# Patient Record
Sex: Female | Born: 1950 | Race: White | Hispanic: No | Marital: Married | Smoking: Never smoker
Health system: Southern US, Community
[De-identification: ages and names within clinical notes are randomized; demographics above are authoritative.]

## PROBLEM LIST (undated history)

## (undated) DIAGNOSIS — F329 Major depressive disorder, single episode, unspecified: Secondary | ICD-10-CM

## (undated) DIAGNOSIS — F32A Depression, unspecified: Secondary | ICD-10-CM

## (undated) DIAGNOSIS — K219 Gastro-esophageal reflux disease without esophagitis: Secondary | ICD-10-CM

## (undated) DIAGNOSIS — E785 Hyperlipidemia, unspecified: Secondary | ICD-10-CM

## (undated) DIAGNOSIS — J310 Chronic rhinitis: Secondary | ICD-10-CM

## (undated) DIAGNOSIS — K760 Fatty (change of) liver, not elsewhere classified: Secondary | ICD-10-CM

## (undated) HISTORY — PX: OOPHORECTOMY: SHX86

---

## 2008-06-07 ENCOUNTER — Ambulatory Visit: Payer: Self-pay | Admitting: Internal Medicine

## 2009-09-13 ENCOUNTER — Ambulatory Visit: Payer: Self-pay | Admitting: Internal Medicine

## 2012-02-21 ENCOUNTER — Ambulatory Visit: Payer: Self-pay | Admitting: Internal Medicine

## 2013-06-15 ENCOUNTER — Ambulatory Visit: Payer: Self-pay | Admitting: Emergency Medicine

## 2014-10-14 ENCOUNTER — Ambulatory Visit
Admission: EM | Admit: 2014-10-14 | Discharge: 2014-10-14 | Disposition: A | Discharge status: Home / Self Care | Payer: Medicare PPO | Attending: Family Medicine | Admitting: Family Medicine

## 2014-10-14 DIAGNOSIS — W5501XA Bitten by cat, initial encounter: Secondary | ICD-10-CM | POA: Diagnosis not present

## 2014-10-14 DIAGNOSIS — S61452A Open bite of left hand, initial encounter: Secondary | ICD-10-CM

## 2014-10-14 DIAGNOSIS — L089 Local infection of the skin and subcutaneous tissue, unspecified: Secondary | ICD-10-CM

## 2014-10-14 HISTORY — DX: Depression, unspecified: F32.A

## 2014-10-14 HISTORY — DX: Gastro-esophageal reflux disease without esophagitis: K21.9

## 2014-10-14 HISTORY — DX: Major depressive disorder, single episode, unspecified: F32.9

## 2014-10-14 MED ORDER — ACETAMINOPHEN 500 MG PO TABS
500.0000 mg | ORAL_TABLET | Freq: Once | ORAL | Status: AC
  Administered 2014-10-14: 500 mg via ORAL

## 2014-10-14 MED ORDER — AMOXICILLIN-POT CLAVULANATE 875-125 MG PO TABS: 1.0000 | ORAL_TABLET | Freq: Two times a day (BID) | ORAL | Status: DC

## 2014-10-14 NOTE — ED Notes (Signed)
Cat bite to left hand about 3 days ago. Now with swelling and redness lateral side dorsal aspect from 4/5th knuckle to past wrist.

## 2014-10-14 NOTE — Discharge Instructions (Signed)
IBuprofen 200 mg 2 tablets ot Acetominophen  325 mg 2 tablets every 6 ours for fever or pain  Warm soaks for comfort  Report back to [place]- or to Emergency Roomwith increased fever, heat/swellig/redness of hand after antibiotics have had second dose     Animal Bite An animal bite can result in a scratch on the skin, deep open cut, puncture of the skin, crush injury, or tearing away of the skin or a body part. Dogs are responsible for most animal bites. Children are bitten more often than adults. An animal bite can range from very mild to more serious. A small bite from your house pet is no cause for alarm. However, some animal bites can become infected or injure a bone or other tissue. You must seek medical care if:  The skin is broken and bleeding does not slow down or stop after 15 minutes.  The puncture is deep and difficult to clean (such as a cat bite).  Pain, warmth, redness, or pus develops around the wound.  The bite is from a stray animal or rodent. There may be a risk of rabies infection.  The bite is from a snake, raccoon, skunk, fox, coyote, or bat. There may be a risk of rabies infection.  The person bitten has a chronic illness such as diabetes, liver disease, or cancer, or the person takes medicine that lowers the immune system.  There is concern about the location and severity of the bite. It is important to clean and protect an animal bite wound right away to prevent infection. Follow these steps:  Clean the wound with plenty of water and soap.  Apply an antibiotic cream.  Apply gentle pressure over the wound with a clean towel or gauze to slow or stop bleeding.  Elevate the affected area above the heart to help stop any bleeding.  Seek medical care. Getting medical care within 8 hours of the animal bite leads to the best possible outcome. DIAGNOSIS  Your caregiver will most likely:  Take a detailed history of the animal and the bite injury.  Perform a wound  exam.  Take your medical history. Blood tests or X-rays may be performed. Sometimes, infected bite wounds are cultured and sent to a lab to identify the infectious bacteria.  TREATMENT  Medical treatment will depend on the location and type of animal bite as well as the patient's medical history. Treatment may include:  Wound care, such as cleaning and flushing the wound with saline solution, bandaging, and elevating the affected area.  Antibiotics.  Tetanus immunization.  Rabies immunization.  Leaving the wound open to heal. This is often done with animal bites, due to the high risk of infection. However, in certain cases, wound closure with stitches, wound adhesive, skin adhesive strips, or staples may be used. Infected bites that are left untreated may require intravenous (IV) antibiotics and surgical treatment in the hospital. HOME CARE INSTRUCTIONS  Follow your caregiver's instructions for wound care.  Take all medicines as directed.  If your caregiver prescribes antibiotics, take them as directed. Finish them even if you start to feel better.  Follow up with your caregiver for further exams or immunizations as directed. You may need a tetanus shot if:  You cannot remember when you had your last tetanus shot.  You have never had a tetanus shot.  The injury broke your skin. If you get a tetanus shot, your arm may swell, get red, and feel warm to the touch. This is  common and not a problem. If you need a tetanus shot and you choose not to have one, there is a rare chance of getting tetanus. Sickness from tetanus can be serious. SEEK MEDICAL CARE IF:  You notice warmth, redness, soreness, swelling, pus discharge, or a bad smell coming from the wound.  You have a red line on the skin coming from the wound.  You have a fever, chills, or a general ill feeling.  You have nausea or vomiting.  You have continued or worsening pain.  You have trouble moving the injured  part.  You have other questions or concerns. MAKE SURE YOU:  Understand these instructions.  Will watch your condition.  Will get help right away if you are not doing well or get worse. Document Released: 12/16/2010 Document Revised: 06/22/2011 Document Reviewed: 12/16/2010 [facility] Patient Information 2015 [place]. This information is not intended to replace advice given to you by your health care provider. Make sure you discuss any questions you have with your health care provider.

## 2014-10-17 ENCOUNTER — Encounter: Payer: Self-pay | Admitting: Physician Assistant

## 2014-10-17 NOTE — ED Provider Notes (Signed)
CSN: [id number]     Arrival date & time 10/14/14  1457 History   First MD Initiated Contact with Patient 10/14/14 1526     Chief Complaint  Patient presents with  . Animal Bite  . Hand Injury   (Consider location/radiation/quality/duration/timing/severity/associated sxs/prior Treatment) HPI 64 yo F brought by her son out of concern about red, swollen left hand and wrist. Bitten by personal cat, of the home, 3 days ago. Patient has been treating it with ice cubes. Cat "Sunshine" [place] [employer]. Patient reports that animals  vaccinations are up to date. Office is closed . Animal Control also closed for the holiday.  Past Medical History  Diagnosis Date  . GERD (gastroesophageal reflux disease)   . Depressed    History reviewed. No pertinent past surgical history. History reviewed. No pertinent family history. History  Substance Use Topics  . Smoking status: Never Smoker   . Smokeless tobacco: Never Used  . Alcohol Use: No   OB History    No data available     Review of Systems Constitutional:Temp 100  Eyes: No visual changes. ENT:No sore throat. Cardiovascular:Negative for chest pain/palpitations Respiratory: Negative for shortness of breath Gastrointestinal: No abdominal pain. No nausea,vomiting, diarrhea Genitourinary: Negative for dysuria. Normal urination. Musculoskeletal: Negative for back pain. Left forearm and wrist dorsum hot, red, painful Skin: positive for erythema Neurological: Negative for headache, focal weakness or numbness  Allergies  Sulfa antibiotics  Home Medications   Prior to Admission medications   Medication Sig Start Date End Date Taking? Authorizing Provider  escitalopram (LEXAPRO) 10 MG tablet Take 10 mg by mouth daily.   Yes Historical Provider, MD  omeprazole (PRILOSEC) 20 MG capsule Take 20 mg by mouth daily.   Yes Historical Provider, MD  amoxicillin-clavulanate (AUGMENTIN)  875-125 MG per tablet Take 1 tablet by mouth every 12 (twelve) hours. 10/14/14   Rae HalstedLaurie W Walker [place], PA-C   BP 140/67 mmHg  Pulse 70  Temp(Src) 100 F (37.8 C) (Tympanic)  Resp 20  Ht 5\' 1"  (1.549 m)  Wt 174 lb (78.926 kg)  BMI 32.89 kg/m2  SpO2 73% Physical Exam   Constitutional -alert and oriented,concerned because hand getting worse Head-atraumatic, normocephalic Eyes- conjunctiva normal, EOMI ,conjugate gaze Nose- no congestion  Mouth/throat- mucous membranes moist , Neck- supple  CV- regular rate,   Resp-no distress, normal respiratory effort, MSK/Skin-  Tender, swollen , inflamed left forearm/wrist/dorsum of hand. Cellulitis 10 cm long by 6 cm wide.Skin marking done.  Warm to palpation, No streaking, Puncture wound site possibly identified. No drainage, no defined abscess. Good pulse, good cap fill. Normal distal sensation Neuro- normal speech and language, Psych-mood and affect grossly normal   ED Course  Procedures (including critical care time) Labs Review Labs Reviewed - No data to display  Imaging Review No results found.   MDM   1. Cat bite of hand, left, initial encounter   2. Cat bite of left hand with infection, initial encounter    Plan: 1. diagnosis reviewed with patient- skin markings reviewed and signifigance of infection margins for future reference 2. Rx as per orders; risks, benefits, potential side effects reviewed with patient 3. Recommend supportive treatment with warm wraps, warm soaks. Tylenol/ibuprofen for fever or discomfort. Cap fill finger checks taught. Elevate hand to reduce swelling. 4. F/u prn if symptoms worsen or don't improve- plan 3 days return for re-evaluation-more quickly if erythema advances outside the  marked margins after antibiotic on board or if fever /malaise develop that are not controlled by ibuprofen /tylenol. Impressed upon her the importance of being proactive with follow up if needed. 5. Police dept notified by nursing staff  and process for animal follow up established.  . Discharge Medication List as of 10/14/2014  3:37 PM    START taking these medications   Details  amoxicillin-clavulanate (AUGMENTIN) 875-125 MG per tablet Take 1 tablet by mouth every 12 (twelve) hours., Starting 10/14/2014, Until Discontinued, Normal          Rae Halsted, PA-C 10/17/14 [phone]

## 2016-12-25 ENCOUNTER — Encounter: Payer: Self-pay | Admitting: Emergency Medicine

## 2016-12-25 ENCOUNTER — Emergency Department
Admission: EM | Admit: 2016-12-25 | Discharge: 2016-12-25 | Disposition: A | Discharge status: Home / Self Care | Payer: Medicare Other | Attending: Emergency Medicine | Admitting: Emergency Medicine

## 2016-12-25 DIAGNOSIS — H5712 Ocular pain, left eye: Secondary | ICD-10-CM | POA: Diagnosis present

## 2016-12-25 DIAGNOSIS — Z79899 Other long term (current) drug therapy: Secondary | ICD-10-CM | POA: Insufficient documentation

## 2016-12-25 DIAGNOSIS — H109 Unspecified conjunctivitis: Secondary | ICD-10-CM | POA: Insufficient documentation

## 2016-12-25 MED ORDER — TOBRAMYCIN 0.3 % OP SOLN: 2.0000 [drp] | OPHTHALMIC | 0 refills | Status: DC

## 2016-12-25 MED ORDER — EYE WASH OPHTH SOLN
1.0000 [drp] | OPHTHALMIC | Status: DC | PRN
  Administered 2016-12-25: 1 [drp] via OPHTHALMIC
  Filled 2016-12-25: qty 118

## 2016-12-25 MED ORDER — TETRACAINE HCL 0.5 % OP SOLN
2.0000 [drp] | Freq: Once | OPHTHALMIC | Status: AC
  Administered 2016-12-25: 2 [drp] via OPHTHALMIC
  Filled 2016-12-25: qty 4

## 2016-12-25 MED ORDER — FLUORESCEIN SODIUM 0.6 MG OP STRP
1.0000 | ORAL_STRIP | Freq: Once | OPHTHALMIC | Status: AC
  Administered 2016-12-25: 1 via OPHTHALMIC
  Filled 2016-12-25: qty 1

## 2016-12-25 NOTE — ED Triage Notes (Signed)
Patient presents to ED via POV from home. Patient states on Wednesday she was mowing the grass and afterwards noticed some eye irritation. Patient states today her eye was swollen this morning. Patient reports clear drainage. Patient has been using eye drops at home.

## 2016-12-25 NOTE — Discharge Instructions (Signed)
Use the eye drops as instructed.  Apply ice as needed for swelling.  If not better in 3 days please follow up with your regular eye doctor or Dr Brooke Dare.  If you have visual changes or increased pain of the eye please return to the emergency room

## 2016-12-25 NOTE — ED Notes (Signed)
Pt discharged to home.  Family member driving.  Discharge instructions reviewed.  Verbalized understanding.  No questions or concerns at this time.  Teach back verified.  Pt in NAD.  No items left in ED.   

## 2016-12-25 NOTE — ED Notes (Signed)
See triage note  Presents with pain and clear drainage from left eye  Unsure if she had gotten some in her eye when she was mowing on weds

## 2016-12-25 NOTE — ED Provider Notes (Signed)
[facility] Emergency Department Provider Note  ____________________________________________   First MD Initiated Contact with Patient 12/25/16 1254     (approximate)  I have reviewed the triage vital signs and the nursing notes.   HISTORY  Chief Complaint Eye Pain    HPI Erica Oneal is a 66 y.o. female who states she was mowing the yard 3 days agoand felt something in her eye. Over the next 2 days the eye has become more red and irritated. She doesn't feel like there is anything in the eye and has not had any visual changes. Denies matting and drainage.  She is due for her yearly eye exam and will be calling her doctor shortly   Past Medical History:  Diagnosis Date  . Depressed   . GERD (gastroesophageal reflux disease)     There are no active problems to display for this patient.   History reviewed. No pertinent surgical history.  Prior to Admission medications   Medication Sig Start Date End Date Taking? Authorizing Provider  escitalopram (LEXAPRO) 10 MG tablet Take 10 mg by mouth daily.    [provider]  omeprazole (PRILOSEC) 20 MG capsule Take 20 mg by mouth daily.    [provider]  tobramycin (TOBREX) 0.3 % ophthalmic solution Place 2 drops into the left eye every 4 (four) hours. 12/25/16   Faythe Ghee, PA-C    Allergies Sulfa antibiotics  No family history on file.  Social History Social History  Substance Use Topics  . Smoking status: Never Smoker  . Smokeless tobacco: Never Used  . Alcohol use No    Review of Systems  Constitutional: No fever/chills Eyes: No visual changes.Positive redness and swelling ENT: No sore throat. Respiratory: Denies coug Musculoskeletal: Negative for back pain. Skin: Negative for rash.    ____________________________________________   PHYSICAL EXAM:  VITAL SIGNS: ED Triage Vitals [12/25/16 1243]  Enc Vitals Group     BP (!) 142/69     Pulse Rate 70     Resp  15     Temp 98.6 F (37 C)     Temp Source Oral     SpO2 95 %     Weight 165 lb (74.8 kg)     Height 5' (1.524 m)     Head Circumference      Peak Flow      Pain Score 0     Pain Loc      Pain Edu?      Excl. in GC?     Constitutional: Alert and oriented. Well appearing and in no acute distress. Eyes: Conjunctivae In the left eye is injected and swollen. No foreign body noted. Applied tetracaine and fluoresceins stain to the left eye. No abrasion or foreign body noted with the [place] lamp lids flipped and no foreign body noted under the lid Head: Atraumatic. Nose: No congestion/rhinnorhea. Mouth/Throat: Mucous membranes are moist.   Cardiovascular: Normal rate, regular rhythm. Respiratory: Normal respiratory effort.  No retractions Musculoskeletal: FROM all extremities, warm and well perfused Neurologic:  Normal speech and language.  Skin:  Skin is warm, dry and intact. No rash noted. Psychiatric: Mood and affect are normal. Speech and behavior are normal.  ____________________________________________   LABS (all labs ordered are listed, but only abnormal results are displayed)  Labs Reviewed - No data to display ____________________________________________   ____________________________________________  RADIOLOGY None  ____________________________________________   PROCEDURES  Procedure(s) performed:       ____________________________________________  INITIAL IMPRESSION / ASSESSMENT AND PLAN / ED COURSE  Pertinent labs & imaging results that were available during my care of the patient were reviewed by me and considered in my medical decision making (see chart for details).  Acute conjunctivitis.  Patient does not have visual changes. Treat with tobramycin ophthalmic drops. Patient is to follow-up with her provider or on-call ophthalmology as recommended       ____________________________________________   FINAL CLINICAL IMPRESSION(S) / ED  DIAGNOSES  Final diagnoses:  Left eye pain  Conjunctivitis of left eye, unspecified conjunctivitis type      NEW MEDICATIONS STARTED DURING THIS VISIT:  New Prescriptions   TOBRAMYCIN (TOBREX) 0.3 % OPHTHALMIC SOLUTION    Place 2 drops into the left eye every 4 (four) hours.     Note:  This document was prepared using Dragon voice recognition software and may include unintentional dictation errors.    Faythe Ghee, PA-C 12/25/16 1352    Schaevitz, Myra Rude, MD 12/25/16 1400

## 2017-06-15 ENCOUNTER — Ambulatory Visit (INDEPENDENT_AMBULATORY_CARE_PROVIDER_SITE_OTHER): Payer: Medicare Other

## 2017-06-15 ENCOUNTER — Other Ambulatory Visit: Payer: Self-pay

## 2017-06-15 ENCOUNTER — Ambulatory Visit
Admission: EM | Admit: 2017-06-15 | Discharge: 2017-06-15 | Disposition: A | Discharge status: Home / Self Care | Payer: Medicare Other | Attending: Family Medicine | Admitting: Family Medicine

## 2017-06-15 ENCOUNTER — Encounter: Payer: Self-pay | Admitting: Emergency Medicine

## 2017-06-15 DIAGNOSIS — S93402A Sprain of unspecified ligament of left ankle, initial encounter: Secondary | ICD-10-CM

## 2017-06-15 DIAGNOSIS — M25572 Pain in left ankle and joints of left foot: Secondary | ICD-10-CM | POA: Diagnosis not present

## 2017-06-15 HISTORY — DX: Hyperlipidemia, unspecified: E78.5

## 2017-06-15 NOTE — ED Triage Notes (Signed)
Patient in today c/o left ankle pain and swelling. She fell down some steps in her basement on 06/12/17.

## 2017-06-15 NOTE — ED Provider Notes (Signed)
[facility]    CSN: [id number] Arrival date & time: 06/15/17  1150     History   Chief Complaint Chief Complaint  Patient presents with  . Ankle Pain    HPI Erica HeadsLinda Oneal is a 67 y.o. female.   67 yo female with a c/o left ankle pain, swelling and bruising for the last 2 days, since falling down some steps at home and injuring it.    The history is provided by the patient.  Ankle Pain    Past Medical History:  Diagnosis Date  . Depressed   . GERD (gastroesophageal reflux disease)   . Hyperlipidemia     There are no active problems to display for this patient.   History reviewed. No pertinent surgical history.  OB History    No data available       Home Medications    Prior to Admission medications   Medication Sig Start Date End Date Taking? Authorizing Provider  atorvastatin (LIPITOR) 80 MG tablet Take 40 mg by mouth daily.   Yes [provider]  escitalopram (LEXAPRO) 10 MG tablet Take 10 mg by mouth daily.   Yes [provider]  omeprazole (PRILOSEC) 20 MG capsule Take 20 mg by mouth daily.   Yes [provider]  tobramycin (TOBREX) 0.3 % ophthalmic solution Place 2 drops into the left eye every 4 (four) hours. 12/25/16   Sherrie MustacheFisher, Roselyn BeringSusan W, PA-C    Family History Family History  Problem Relation Age of Onset  . Alzheimer's disease Mother   . Heart attack Father     Social History Social History   Tobacco Use  . Smoking status: Never Smoker  . Smokeless tobacco: Never Used  Substance Use Topics  . Alcohol use: No  . Drug use: No     Allergies   Sulfa antibiotics   Review of Systems Review of Systems   Physical Exam Triage Vital Signs ED Triage Vitals  Enc Vitals Group     BP 06/15/17 1211 132/79     Pulse Rate 06/15/17 1211 70     Resp 06/15/17 1211 16     Temp 06/15/17 1211 98.2 F (36.8 C)     Temp Source 06/15/17 1211 Oral     SpO2 06/15/17 1211 100 %     Weight 06/15/17 1210 170 lb (77.1  kg)     Height 06/15/17 1210 5' (1.524 m)     Head Circumference --      Peak Flow --      Pain Score 06/15/17 1210 6     Pain Loc --      Pain Edu? --      Excl. in GC? --    No data found.  Updated Vital Signs BP 132/79 (BP Location: Left Arm)   Pulse 70   Temp 98.2 F (36.8 C) (Oral)   Resp 16   Ht 5' (1.524 m)   Wt 170 lb (77.1 kg)   SpO2 100%   BMI 33.20 kg/m   Visual Acuity Right Eye Distance:   Left Eye Distance:   Bilateral Distance:    Right Eye Near:   Left Eye Near:    Bilateral Near:     Physical Exam  Constitutional: She appears well-developed and well-nourished. No distress.  Musculoskeletal:       Left ankle: She exhibits swelling and ecchymosis. She exhibits normal range of motion, no deformity, no laceration and normal pulse. Tenderness. Lateral malleolus and AITFL tenderness found.  No medial malleolus, no CF ligament, no posterior TFL, no head of 5th metatarsal and no proximal fibula tenderness found. Achilles tendon normal.  Skin: She is not diaphoretic.  Nursing note and vitals reviewed.    UC Treatments / Results  Labs (all labs ordered are listed, but only abnormal results are displayed) Labs Reviewed - No data to display  EKG  EKG Interpretation None       Radiology Dg Ankle Complete Left  Result Date: 06/15/2017 CLINICAL DATA:  Larey Seat 2 days ago.  Lateral ankle pain. EXAM: LEFT ANKLE COMPLETE - 3+ VIEW COMPARISON:  09/13/2009 FINDINGS: Mild lateral soft tissue swelling. No sign of fracture or dislocation. Minimal bony irregularity of the superior surface of the anterior process of the talus is chronic related to an injury in 2011. IMPRESSION: Lateral soft tissue swelling.  No acute bone finding. Electronically Signed   By: Paulina Fusi M.D.   On: 06/15/2017 12:43    Procedures Procedures (including critical care time)  Medications Ordered in UC Medications - No data to display   Initial Impression / Assessment and Plan / UC Course   I have reviewed the triage vital signs and the nursing notes.  Pertinent labs & imaging results that were available during my care of the patient were reviewed by me and considered in my medical decision making (see chart for details).       Final Clinical Impressions(s) / UC Diagnoses   Final diagnoses:  Sprain of left ankle, unspecified ligament, initial encounter    ED Discharge Orders    None     1. x-ray results and diagnosis reviewed with patient 2.. Recommend supportive treatment with rest, ice, elevation, ankle brace; otc analgesics prn 3. Follow-up prn if symptoms worsen or don't improve  Controlled Substance Prescriptions  Controlled Substance Registry consulted? Not Applicable   Payton Mccallum, MD 06/15/17 [phone]

## 2019-03-30 IMAGING — CR DG ANKLE COMPLETE 3+V*L*
3 series · 3 of 3 positions shown · non-contrast
Comparison: 09/13/2009

CLINICAL DATA: Fell 2 days ago.  Lateral ankle pain.

EXAM:
LEFT ANKLE COMPLETE - 3+ VIEW

[ankle ap]
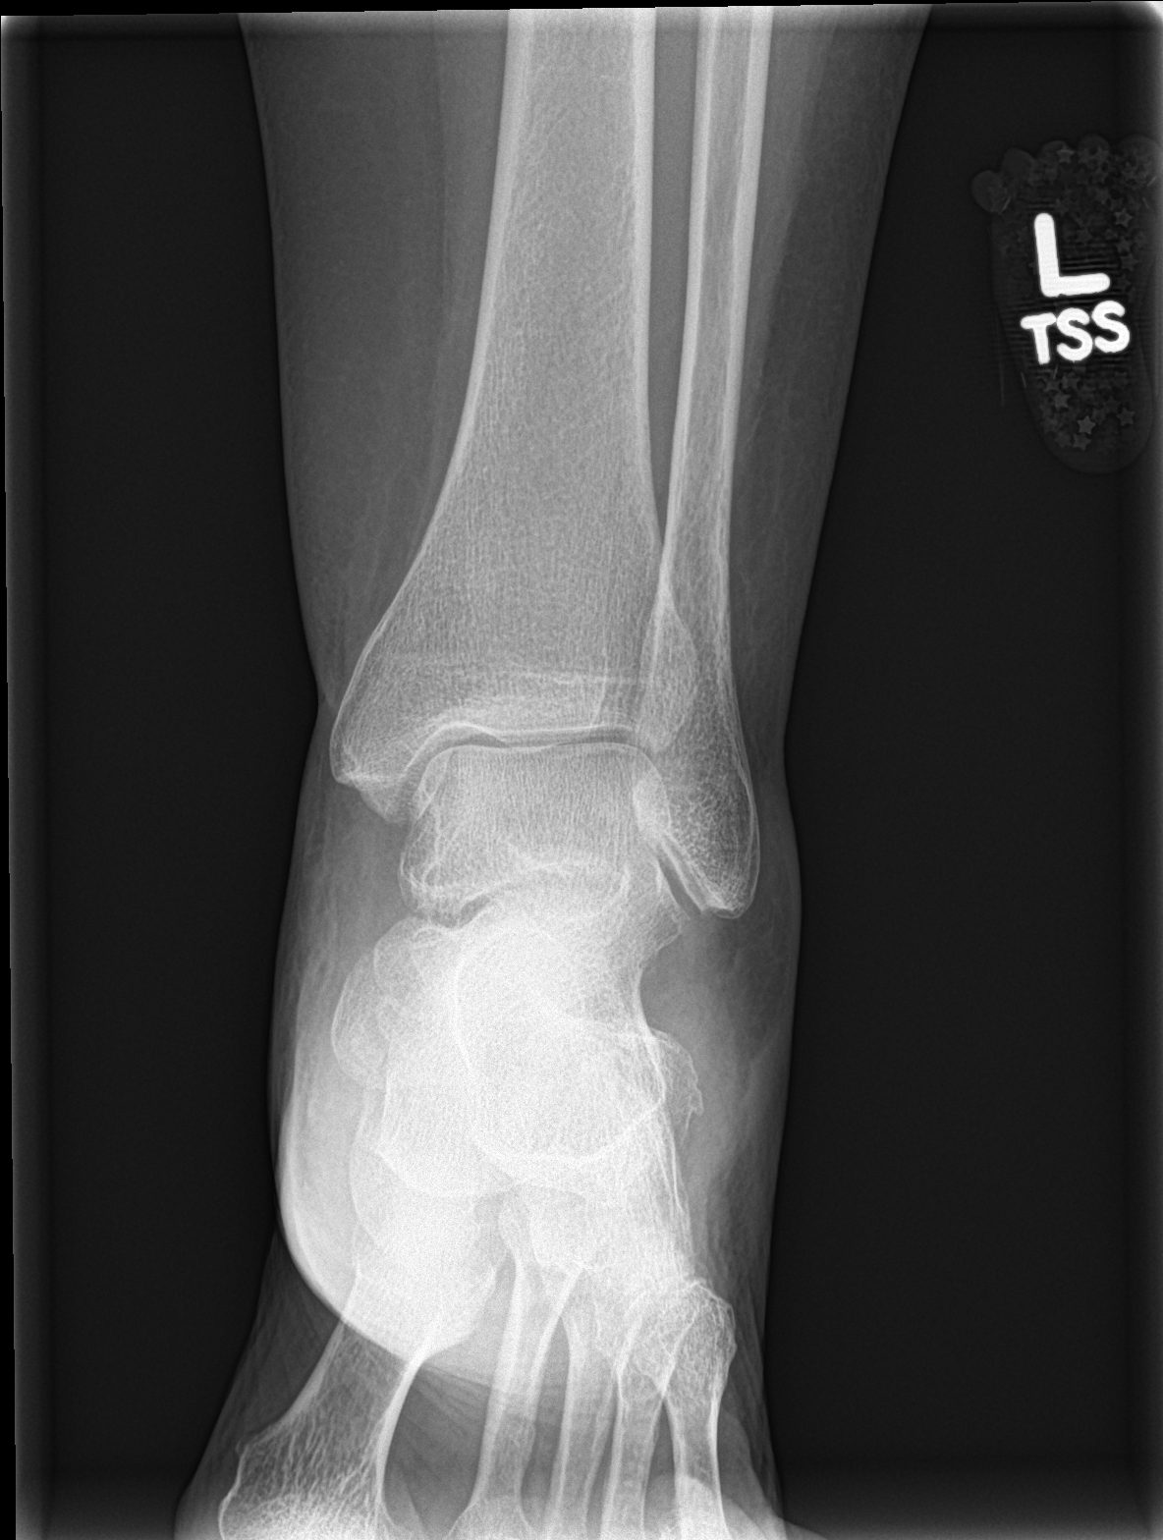

[ankle obl]
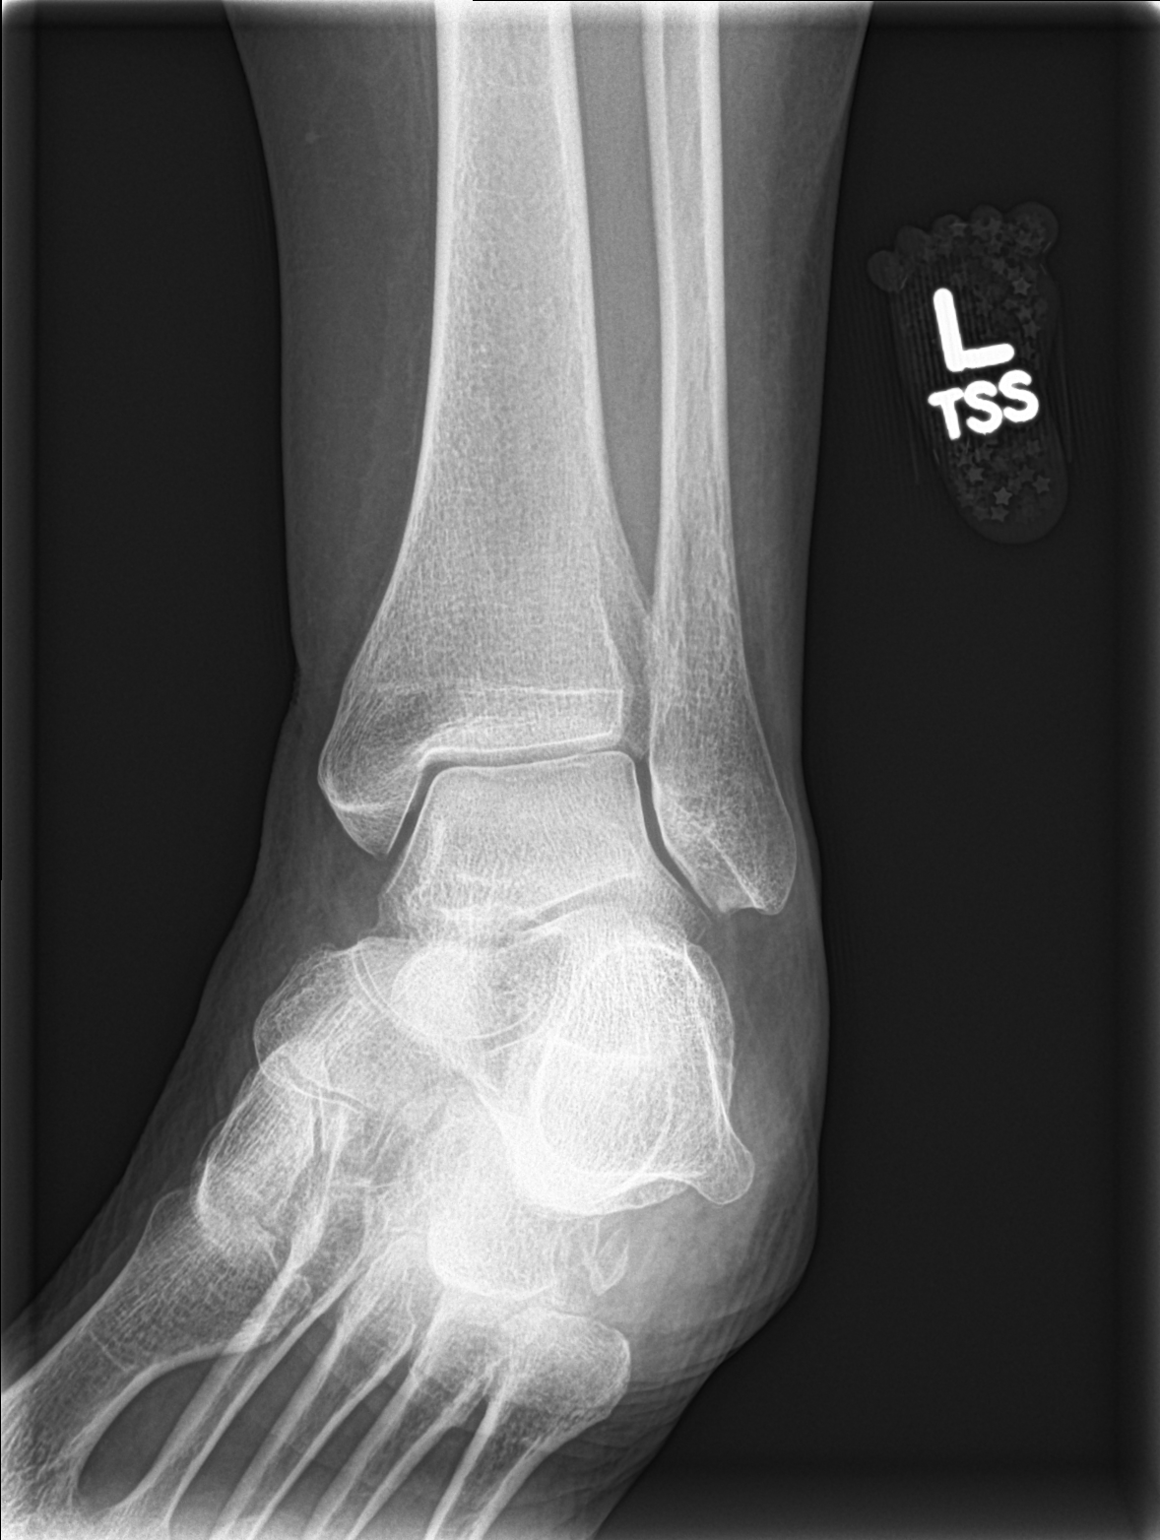

[ankle lat]
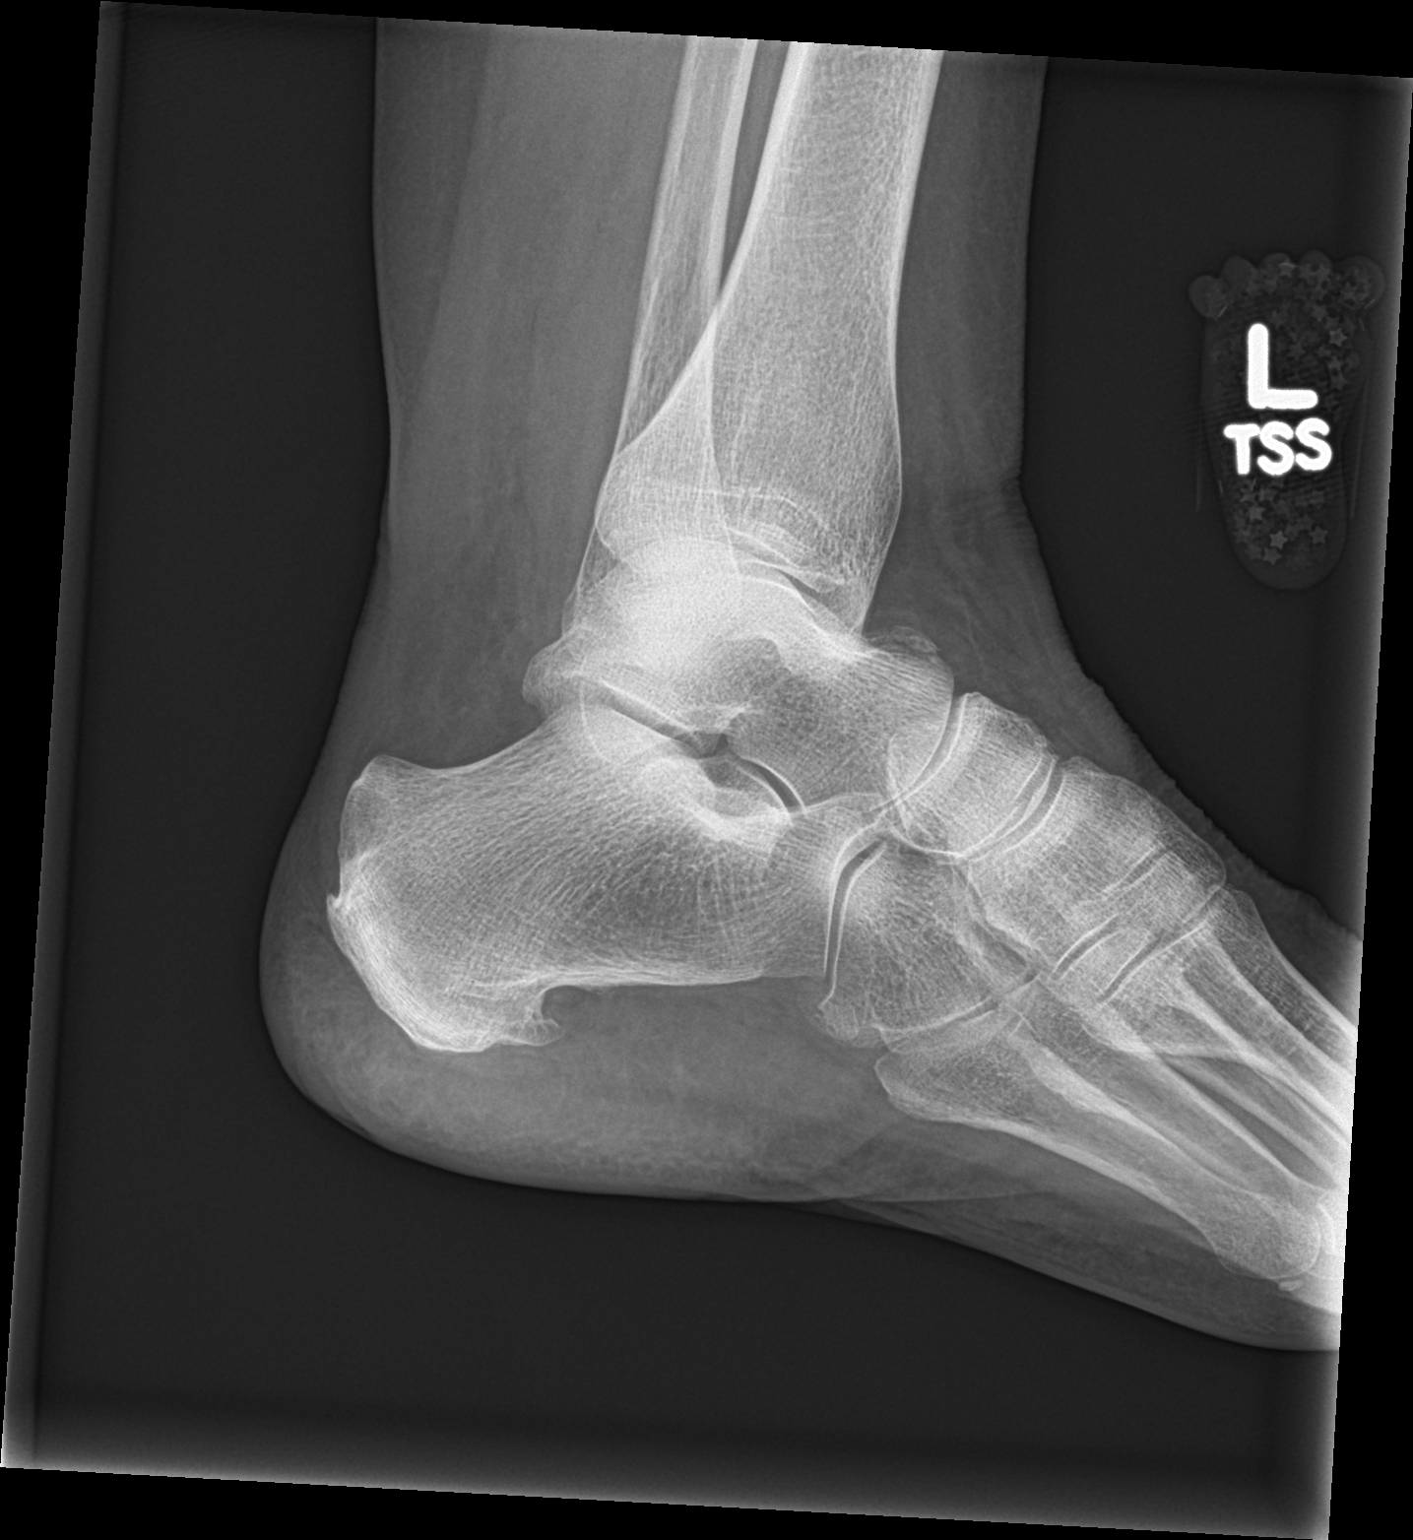

[3 of 3 positions shown; findings below may reference images not displayed]

FINDINGS: Mild lateral soft tissue swelling. No sign of fracture or
dislocation. Minimal bony irregularity of the superior surface of
the anterior process of the talus is chronic related to an injury in
9200.
IMPRESSION: Lateral soft tissue swelling.  No acute bone finding.

## 2020-03-22 ENCOUNTER — Other Ambulatory Visit: Payer: Self-pay

## 2020-03-22 ENCOUNTER — Ambulatory Visit
Admission: EM | Admit: 2020-03-22 | Discharge: 2020-03-22 | Disposition: A | Discharge status: Home / Self Care | Payer: Medicare PPO | Attending: Sports Medicine | Admitting: Sports Medicine

## 2020-03-22 ENCOUNTER — Encounter: Payer: Self-pay | Admitting: Emergency Medicine

## 2020-03-22 DIAGNOSIS — L237 Allergic contact dermatitis due to plants, except food: Secondary | ICD-10-CM

## 2020-03-22 MED ORDER — METHYLPREDNISOLONE SODIUM SUCC 125 MG IJ SOLR
125.0000 mg | Freq: Once | INTRAMUSCULAR | Status: AC
  Administered 2020-03-22: 125 mg via INTRAMUSCULAR

## 2020-03-22 NOTE — ED Provider Notes (Signed)
[facility]    CSN: [id number] Arrival date & time: 03/22/20  1126      History   Chief Complaint Chief Complaint  Patient presents with  . Poison Ivy    HPI Erica Oneal is a 69 y.o. female.   69 yo female with rash that she attributes to poison ivy exposure 1 week ago.  Supportive care is not helping and rash has spread from left forearm to the right forearm, abdomen and left upper thigh.  No fevers, shakes, chills.  No red flag signs or symptoms.     Past Medical History:  Diagnosis Date  . Depressed   . GERD (gastroesophageal reflux disease)   . Hyperlipidemia     There are no problems to display for this patient.   History reviewed. No pertinent surgical history.  OB History   No obstetric history on file.      Home Medications    Prior to Admission medications   Medication Sig Start Date End Date Taking? Authorizing Provider  atorvastatin (LIPITOR) 80 MG tablet Take 40 mg by mouth daily.   Yes [provider]  escitalopram (LEXAPRO) 10 MG tablet Take 10 mg by mouth daily.   Yes [provider]  omeprazole (PRILOSEC) 20 MG capsule Take 20 mg by mouth daily.   Yes [provider]  tobramycin (TOBREX) 0.3 % ophthalmic solution Place 2 drops into the left eye every 4 (four) hours. 12/25/16   Sherrie Mustache Roselyn Bering, PA-C    Family History Family History  Problem Relation Age of Onset  . Alzheimer's disease Mother   . Heart attack Father     Social History Social History   Tobacco Use  . Smoking status: Never Smoker  . Smokeless tobacco: Never Used  Vaping Use  . Vaping Use: Never used  Substance Use Topics  . Alcohol use: No  . Drug use: No     Allergies   Sulfa antibiotics   Review of Systems Review of Systems  Skin: Positive for rash. Negative for pallor and wound.  All other systems reviewed and are negative.    Physical Exam Triage Vital Signs ED Triage Vitals [03/22/20 1142]  Enc Vitals Group      BP (!) 153/79     Pulse Rate 72     Resp 16     Temp 98.8 F (37.1 C)     Temp Source Oral     SpO2 98 %     Weight      Height      Head Circumference      Peak Flow      Pain Score 0     Pain Loc      Pain Edu?      Excl. in GC?    No data found.  Updated Vital Signs BP (!) 153/79 (BP Location: Left Arm)   Pulse 72   Temp 98.8 F (37.1 C) (Oral)   Resp 16   SpO2 98%   Visual Acuity Right Eye Distance:   Left Eye Distance:   Bilateral Distance:    Right Eye Near:   Left Eye Near:    Bilateral Near:     Physical Exam Vitals and nursing note reviewed.  Constitutional:      General: She is not in acute distress.    Appearance: Normal appearance. She is not ill-appearing or toxic-appearing.  HENT:     Head: Normocephalic and atraumatic.  Skin:    General: Skin  is warm and dry.     Capillary Refill: Capillary refill takes less than 2 seconds.     Findings: Rash present.     Comments: Area of small intensely pruritic vesicles on the left forearm, right forearm, left thigh, and abdomen consistent with poison ivy dermatitis.  No evidence of superimposed bacterial infection, No drainage.  Neurological:     General: No focal deficit present.     Mental Status: She is alert.  Psychiatric:        Mood and Affect: Mood normal.        Behavior: Behavior normal.      UC Treatments / Results  Labs (all labs ordered are listed, but only abnormal results are displayed) Labs Reviewed - No data to display  EKG   Radiology No results found.  Procedures Procedures (including critical care time)  Medications Ordered in UC Medications  methylPREDNISolone sodium succinate (SOLU-MEDROL) 125 mg/2 mL injection 125 mg (125 mg Intramuscular Given 03/22/20 1217)    Initial Impression / Assessment and Plan / UC Course  I have reviewed the triage vital signs and the nursing notes.  Pertinent labs & imaging results that were available during my care of the patient  were reviewed by me and considered in my medical decision making (see chart for details).      Final Clinical Impressions(s) / UC Diagnoses   Final diagnoses:  Poison ivy dermatitis     Discharge Instructions     Return if symptoms persist or worsen    ED Prescriptions    None     PDMP not reviewed this encounter.   Delton See, MD 03/22/20 1248

## 2020-03-22 NOTE — Discharge Instructions (Signed)
Return if symptoms persist or worsen.

## 2020-03-22 NOTE — ED Triage Notes (Signed)
Pt c/o poison ivy onset last Thursday. Pt states she has a vine in her backyard she assumed was dead. Pt states she has been using oatmeal and calamine with no relief.

## 2022-08-10 ENCOUNTER — Ambulatory Visit
Admission: EM | Admit: 2022-08-10 | Discharge: 2022-08-10 | Disposition: A | Discharge status: Home / Self Care | Payer: Medicare PPO | Attending: Emergency Medicine | Admitting: Emergency Medicine

## 2022-08-10 ENCOUNTER — Encounter: Payer: Self-pay | Admitting: Emergency Medicine

## 2022-08-10 DIAGNOSIS — S60359A Superficial foreign body of unspecified thumb, initial encounter: Secondary | ICD-10-CM | POA: Diagnosis not present

## 2022-08-10 DIAGNOSIS — S6992XA Unspecified injury of left wrist, hand and finger(s), initial encounter: Secondary | ICD-10-CM | POA: Diagnosis not present

## 2022-08-10 MED ORDER — CEPHALEXIN 500 MG PO CAPS: 500.0000 mg | ORAL_CAPSULE | Freq: Three times a day (TID) | ORAL | 0 refills | Status: AC

## 2022-08-10 MED ORDER — TETANUS-DIPHTH-ACELL PERTUSSIS 5-2.5-18.5 LF-MCG/0.5 IM SUSY
0.5000 mL | PREFILLED_SYRINGE | Freq: Once | INTRAMUSCULAR | Status: AC
  Administered 2022-08-10: 0.5 mL via INTRAMUSCULAR

## 2022-08-10 NOTE — Discharge Instructions (Addendum)
Keep your wound clean and dry.  Leave the dressing that you have on in place for the next 24 hours.  Tomorrow morning when she remove the dressing, wash her hand with warm water and soap, and apply a new dressing to the wound.  Starting today I want you to take Keflex 500 mg 3 times a day with food to prevent infection.  You will take this for 7 days.  You can apply ice to your thumb for 20 minutes at a time 2-3 times a day to help with pain and swelling.  You may also use over-the-counter Tylenol and/or ibuprofen according to the package instructions as needed for pain and swelling.  If you develop any increased redness, swelling, pus drainage, red streaks going up your finger, or fever you need to go to the ER for evaluation.

## 2022-08-10 NOTE — ED Provider Notes (Signed)
[facility]    CSN: [id number] Arrival date & time: 08/10/22  1230      History   Chief Complaint Chief Complaint  Patient presents with   Foreign Body in Skin    HPI Bennett Vanscyoc is a 72 y.o. female.   HPI  72 year old female with a past medical history significant for GERD, depression, and hyperlipidemia presents for evaluation of fishhook foreign body in her left thumb that happened today.  No tetanus documentation in epic and patient is unsure of her last tetanus shot.  It was a new hook but it had been bated with a warm prior to being caught in her thumb.  No numbness or tingling.  Past Medical History:  Diagnosis Date   Depressed    GERD (gastroesophageal reflux disease)    Hyperlipidemia     There are no problems to display for this patient.   History reviewed. No pertinent surgical history.  OB History   No obstetric history on file.      Home Medications    Prior to Admission medications   Medication Sig Start Date End Date Taking? Authorizing Provider  cephALEXin (KEFLEX) 500 MG capsule Take 1 capsule (500 mg total) by mouth 3 (three) times daily for 7 days. 08/10/22 08/17/22 Yes Becky Augusta, NP  atorvastatin (LIPITOR) 80 MG tablet Take 40 mg by mouth daily.    [provider]  escitalopram (LEXAPRO) 10 MG tablet Take 10 mg by mouth daily.    [provider]  omeprazole (PRILOSEC) 20 MG capsule Take 20 mg by mouth daily.    [provider]  tobramycin (TOBREX) 0.3 % ophthalmic solution Place 2 drops into the left eye every 4 (four) hours. 12/25/16   Faythe Ghee, PA-C    Family History Family History  Problem Relation Age of Onset   Alzheimer's disease Mother    Heart attack Father     Social History Social History   Tobacco Use   Smoking status: Never   Smokeless tobacco: Never  Vaping Use   Vaping Use: Never used  Substance Use Topics   Alcohol use: No   Drug use: No     Allergies   Prednisone  and Sulfa antibiotics   Review of Systems Review of Systems  Constitutional:  Negative for fever.  Musculoskeletal:  Positive for myalgias. Negative for arthralgias and joint swelling.  Skin:  Negative for color change.  Neurological:  Negative for numbness.     Physical Exam Triage Vital Signs ED Triage Vitals [08/10/22 1302]  Enc Vitals Group     BP 138/81     Pulse Rate 89     Resp 16     Temp 98.3 F (36.8 C)     Temp Source Oral     SpO2 97 %     Weight      Height      Head Circumference      Peak Flow      Pain Score 3     Pain Loc      Pain Edu?      Excl. in GC?    No data found.  Updated Vital Signs BP 138/81 (BP Location: Left Arm)   Pulse 89   Temp 98.3 F (36.8 C) (Oral)   Resp 16   SpO2 97%   Visual Acuity Right Eye Distance:   Left Eye Distance:   Bilateral Distance:    Right Eye Near:   Left Eye Near:  Bilateral Near:     Physical Exam Vitals and nursing note reviewed.  Constitutional:      Appearance: Normal appearance. She is not ill-appearing.  Musculoskeletal:        General: Tenderness and signs of injury present. No swelling or deformity.  Skin:    Capillary Refill: Capillary refill takes less than 2 seconds.     Findings: No bruising or erythema.  Neurological:     General: No focal deficit present.     Mental Status: She is alert and oriented to person, place, and time.      UC Treatments / Results  Labs (all labs ordered are listed, but only abnormal results are displayed) Labs Reviewed - No data to display  EKG   Radiology No results found.  Procedures Procedures (including critical care time)  Medications Ordered in UC Medications  Tdap (BOOSTRIX) injection 0.5 mL (has no administration in time range)    Initial Impression / Assessment and Plan / UC Course  I have reviewed the triage vital signs and the nursing notes.  Pertinent labs & imaging results that were available during my care of the patient  were reviewed by me and considered in my medical decision making (see chart for details).   Patient is a pleasant, nontoxic-appearing 72 year old female here for evaluation of a fishhook that is embedded in her left thumb.  As you can see in image above the hook is embedded in the proximal and medial aspect of the distal phalanx of the left thumb.  The hook is barbed and it was bated prior to being embedded in the patient's thumb.  I will perform local infiltration of 1% lidocaine without epi, cut the eye of the hook, and feed it through the soft tissue to remove it.  I have ordered that the patient's tetanus shot be updated as she is unsure of her last tetanus shot.  She has an allergy to sulfa and prednisone.  I will discharge her home on Keflex 500 mg 3 times a day for the next 7 days as prophylaxis.  The thumb was anesthetized using 1.5 mL of 1% lidocaine without epi.  Good anesthesia was achieved.  The thumb was cleaned before and after anesthesia with chlorhexidine and saline.  The eye of the hook was removed using a pair of wire cutters and then and needle drivers were used to feed the hook through the soft tissue and remove it.  Removal was successful.  Wound was again cleansed with chlorhexidine and saline, pressure held to achieve hemostasis, and a dressing of bacitracin, nonadherent gauze, and Coban was applied.  Final Clinical Impressions(s) / UC Diagnoses   Final diagnoses:  Metal foreign body in thumb  Fishhook injury to finger, left, initial encounter     Discharge Instructions      Keep your wound clean and dry.  Leave the dressing that you have on in place for the next 24 hours.  Tomorrow morning when she remove the dressing, wash her hand with warm water and soap, and apply a new dressing to the wound.  Starting today I want you to take Keflex 500 mg 3 times a day with food to prevent infection.  You will take this for 7 days.  You can apply ice to your thumb for 20 minutes  at a time 2-3 times a day to help with pain and swelling.  You may also use over-the-counter Tylenol and/or ibuprofen according to the package instructions as needed for pain  and swelling.  If you develop any increased redness, swelling, pus drainage, red streaks going up your finger, or fever you need to go to the ER for evaluation.     ED Prescriptions     Medication Sig Dispense Auth. Provider   cephALEXin (KEFLEX) 500 MG capsule Take 1 capsule (500 mg total) by mouth 3 (three) times daily for 7 days. 21 capsule Becky Augusta, NP      PDMP not reviewed this encounter.   Becky Augusta, NP 08/10/22 1423

## 2022-08-10 NOTE — ED Triage Notes (Signed)
Pt presents with a fishing hook stuck in her left thumb today.

## 2023-03-03 ENCOUNTER — Encounter: Payer: Self-pay | Admitting: Ophthalmology

## 2023-03-05 ENCOUNTER — Encounter: Payer: Self-pay | Admitting: Ophthalmology

## 2023-03-05 NOTE — Discharge Instructions (Signed)

## 2023-03-05 NOTE — Anesthesia Preprocedure Evaluation (Signed)
Anesthesia Evaluation  Patient identified by MRN, date of birth, ID band Patient awake    Reviewed: Allergy & Precautions, H&P , NPO status , Patient's Chart, lab work & pertinent test results  Airway Mallampati: III  TM Distance: <3 FB Neck ROM: Full    Dental no notable dental hx. (+) Caps   Pulmonary neg pulmonary ROS   Pulmonary exam normal breath sounds clear to auscultation       Cardiovascular negative cardio ROS Normal cardiovascular exam Rhythm:Regular Rate:Normal     Neuro/Psych  PSYCHIATRIC DISORDERS  Depression    negative neurological ROS  negative psych ROS   GI/Hepatic negative GI ROS, Neg liver ROS,GERD  ,,  Endo/Other  negative endocrine ROS    Renal/GU negative Renal ROS  negative genitourinary   Musculoskeletal negative musculoskeletal ROS (+)    Abdominal   Peds negative pediatric ROS (+)  Hematology negative hematology ROS (+)   Anesthesia Other Findings   GERD (gastroesophageal reflux disease) Depressed Hyperlipidemia  NAFLD (nonalcoholic fatty liver disease) Chronic rhinitis      Reproductive/Obstetrics negative OB ROS                             Anesthesia Physical Anesthesia Plan  ASA: 3  Anesthesia Plan: MAC   Post-op Pain Management:    Induction: Intravenous  PONV Risk Score and Plan:   Airway Management Planned: Natural Airway and Nasal Cannula  Additional Equipment:   Intra-op Plan:   Post-operative Plan:   Informed Consent: I have reviewed the patients History and Physical, chart, labs and discussed the procedure including the risks, benefits and alternatives for the proposed anesthesia with the patient or authorized representative who has indicated his/her understanding and acceptance.     Dental Advisory Given  Plan Discussed with: Anesthesiologist, CRNA and Surgeon  Anesthesia Plan Comments: (Patient consented for risks of  anesthesia including but not limited to:  - adverse reactions to medications - damage to eyes, teeth, lips or other oral mucosa - nerve damage due to positioning  - sore throat or hoarseness - Damage to heart, brain, nerves, lungs, other parts of body or loss of life  Patient voiced understanding and assent.)        Anesthesia Quick Evaluation

## 2023-03-08 ENCOUNTER — Ambulatory Visit: Payer: Self-pay | Admitting: Anesthesiology

## 2023-03-08 ENCOUNTER — Ambulatory Visit
Admission: RE | Admit: 2023-03-08 | Discharge: 2023-03-08 | Disposition: A | Discharge status: Home / Self Care | Payer: Medicare PPO | Attending: Ophthalmology | Admitting: Ophthalmology

## 2023-03-08 ENCOUNTER — Ambulatory Visit: Payer: Medicare PPO | Admitting: Anesthesiology

## 2023-03-08 ENCOUNTER — Encounter
Admission: RE | Disposition: A | Discharge status: Home / Self Care | Payer: Self-pay | Source: Home / Self Care | Attending: Ophthalmology

## 2023-03-08 ENCOUNTER — Other Ambulatory Visit: Payer: Self-pay

## 2023-03-08 ENCOUNTER — Encounter: Payer: Self-pay | Admitting: Ophthalmology

## 2023-03-08 DIAGNOSIS — K219 Gastro-esophageal reflux disease without esophagitis: Secondary | ICD-10-CM | POA: Diagnosis not present

## 2023-03-08 DIAGNOSIS — H2511 Age-related nuclear cataract, right eye: Secondary | ICD-10-CM | POA: Diagnosis present

## 2023-03-08 DIAGNOSIS — E785 Hyperlipidemia, unspecified: Secondary | ICD-10-CM | POA: Insufficient documentation

## 2023-03-08 DIAGNOSIS — K760 Fatty (change of) liver, not elsewhere classified: Secondary | ICD-10-CM | POA: Insufficient documentation

## 2023-03-08 HISTORY — DX: Chronic rhinitis: J31.0

## 2023-03-08 HISTORY — PX: CATARACT EXTRACTION W/PHACO: SHX586

## 2023-03-08 HISTORY — DX: Fatty (change of) liver, not elsewhere classified: K76.0

## 2023-03-08 SURGERY — PHACOEMULSIFICATION, CATARACT, WITH IOL INSERTION
Anesthesia: Monitor Anesthesia Care | Site: Eye | Laterality: Right

## 2023-03-08 MED ORDER — TETRACAINE HCL 0.5 % OP SOLN
OPHTHALMIC | Status: AC
  Filled 2023-03-08: qty 4

## 2023-03-08 MED ORDER — SIGHTPATH DOSE#1 BSS IO SOLN
INTRAOCULAR | Status: DC | PRN
  Administered 2023-03-08: 112 mL via OPHTHALMIC

## 2023-03-08 MED ORDER — MIDAZOLAM HCL 2 MG/2ML IJ SOLN
INTRAMUSCULAR | Status: DC | PRN
  Administered 2023-03-08: 1 mg via INTRAVENOUS

## 2023-03-08 MED ORDER — SIGHTPATH DOSE#1 BSS IO SOLN
INTRAOCULAR | Status: DC | PRN
  Administered 2023-03-08: 15 mL

## 2023-03-08 MED ORDER — FENTANYL CITRATE (PF) 100 MCG/2ML IJ SOLN
INTRAMUSCULAR | Status: AC
  Filled 2023-03-08: qty 2

## 2023-03-08 MED ORDER — ARMC OPHTHALMIC DILATING DROPS
1.0000 | OPHTHALMIC | Status: DC | PRN
  Administered 2023-03-08 (×3): 1 via OPHTHALMIC

## 2023-03-08 MED ORDER — SIGHTPATH DOSE#1 NA HYALUR & NA CHOND-NA HYALUR IO KIT
PACK | INTRAOCULAR | Status: DC | PRN
  Administered 2023-03-08: 1 via OPHTHALMIC

## 2023-03-08 MED ORDER — LIDOCAINE HCL (PF) 2 % IJ SOLN
INTRAOCULAR | Status: DC | PRN
  Administered 2023-03-08: 1 mL via INTRAOCULAR

## 2023-03-08 MED ORDER — MIDAZOLAM HCL 2 MG/2ML IJ SOLN
INTRAMUSCULAR | Status: AC
Start: 2023-03-08 — End: ?
  Filled 2023-03-08: qty 2

## 2023-03-08 MED ORDER — MOXIFLOXACIN HCL 0.5 % OP SOLN
OPHTHALMIC | Status: DC | PRN
  Administered 2023-03-08: .2 mL via OPHTHALMIC

## 2023-03-08 MED ORDER — ARMC OPHTHALMIC DILATING DROPS
OPHTHALMIC | Status: AC
  Filled 2023-03-08: qty 0.5

## 2023-03-08 MED ORDER — TETRACAINE HCL 0.5 % OP SOLN
1.0000 [drp] | OPHTHALMIC | Status: DC | PRN
  Administered 2023-03-08 (×3): 1 [drp] via OPHTHALMIC

## 2023-03-08 MED ORDER — FENTANYL CITRATE (PF) 100 MCG/2ML IJ SOLN
INTRAMUSCULAR | Status: DC | PRN
  Administered 2023-03-08: 50 ug via INTRAVENOUS

## 2023-03-08 SURGICAL SUPPLY — 11 items
CATARACT SUITE SIGHTPATH (MISCELLANEOUS) ×1
DISSECTOR HYDRO NUCLEUS 50X22 (MISCELLANEOUS) ×1 IMPLANT
FEE CATARACT SUITE SIGHTPATH (MISCELLANEOUS) ×1 IMPLANT
GLOVE PI ULTRA LF STRL 7.5 (GLOVE) ×1 IMPLANT
GLOVE SURG POLYISOPRENE 8.5 (GLOVE) ×1
GLOVE SURG SYN 8.5 PF PI BL (GLOVE) ×1 IMPLANT
LENS IOL TECNIS EYHANCE 19.5 (Intraocular Lens) IMPLANT
NDL FILTER BLUNT 18X1 1/2 (NEEDLE) ×1 IMPLANT
NEEDLE FILTER BLUNT 18X1 1/2 (NEEDLE) ×1
SYR 3ML LL SCALE MARK (SYRINGE) ×1 IMPLANT
SYR 5ML LL (SYRINGE) ×1 IMPLANT

## 2023-03-08 NOTE — Op Note (Signed)
OPERATIVE NOTE  Erica Oneal [id number] 03/08/2023   PREOPERATIVE DIAGNOSIS:  Nuclear sclerotic cataract right eye.  H25.11   POSTOPERATIVE DIAGNOSIS:    Nuclear sclerotic cataract right eye.     PROCEDURE:  Phacoemusification with posterior chamber intraocular lens placement of the right eye   LENS:   Implant Name Type Inv. Item Serial No. Manufacturer Lot No. LRB No. Used Action  LENS IOL TECNIS EYHANCE 19.5 - [id number] Intraocular Lens LENS IOL TECNIS EYHANCE 19.5 [id number] [employer]  Right 1 Implanted       Procedure(s): CATARACT EXTRACTION PHACO AND INTRAOCULAR LENS PLACEMENT (IOC) RIGHT  4.62  00:38.4 (Right)  SURGEON:  Willey Blade, MD, MPH  ANESTHESIOLOGIST: Anesthesiologist: Marisue Humble, MD CRNA: Emeterio Reeve, CRNA   ANESTHESIA:  Topical with tetracaine drops augmented with 1% preservative-free intracameral lidocaine.  ESTIMATED BLOOD LOSS: less than 1 mL.   COMPLICATIONS:  None.   DESCRIPTION OF PROCEDURE:  The patient was identified in the holding room and transported to the operating room and placed in the supine position under the operating microscope.  The right eye was identified as the operative eye and it was prepped and draped in the usual sterile ophthalmic fashion.   A 1.0 millimeter clear-corneal paracentesis was made at the 10:30 position. 0.5 ml of preservative-free 1% lidocaine with epinephrine was injected into the anterior chamber.  The anterior chamber was filled with viscoelastic.  A 2.4 millimeter keratome was used to make a near-clear corneal incision at the 8:00 position.  A curvilinear capsulorrhexis was made with a cystotome and capsulorrhexis forceps.  Balanced salt solution was used to hydrodissect and hydrodelineate the nucleus.   Phacoemulsification was then used in stop and chop fashion to remove the lens nucleus and epinucleus.  The remaining cortex was then removed using the irrigation and aspiration handpiece. Viscoelastic was  then placed into the capsular bag to distend it for lens placement.  A lens was then injected into the capsular bag.  The remaining viscoelastic was aspirated.   Wounds were hydrated with balanced salt solution.  The anterior chamber was inflated to a physiologic pressure with balanced salt solution.   Intracameral vigamox 0.1 mL undiluted was injected into the eye and a drop placed onto the ocular surface.  No wound leaks were noted.  The patient was taken to the recovery room in stable condition without complications of anesthesia or surgery  Willey Blade 03/08/2023, 11:52 AM

## 2023-03-08 NOTE — Transfer of Care (Signed)
Immediate Anesthesia Transfer of Care Note  Patient: Erica Oneal  Procedure(s) Performed: CATARACT EXTRACTION PHACO AND INTRAOCULAR LENS PLACEMENT (IOC) RIGHT  4.62  00:38.4 (Right: Eye)  Patient Location: PACU  Anesthesia Type: MAC  Level of Consciousness: awake, alert  and patient cooperative  Airway and Oxygen Therapy: Patient Spontanous Breathing and Patient connected to supplemental oxygen  Post-op Assessment: Post-op Vital signs reviewed, Patient's Cardiovascular Status Stable, Respiratory Function Stable, Patent Airway and No signs of Nausea or vomiting  Post-op Vital Signs: Reviewed and stable  Complications: No notable events documented.

## 2023-03-08 NOTE — Anesthesia Postprocedure Evaluation (Signed)
Anesthesia Post Note  Patient: Erica Oneal  Procedure(s) Performed: CATARACT EXTRACTION PHACO AND INTRAOCULAR LENS PLACEMENT (IOC) RIGHT  4.62  00:38.4 (Right: Eye)  Patient location during evaluation: PACU Anesthesia Type: MAC Level of consciousness: awake and alert Pain management: pain level controlled Vital Signs Assessment: post-procedure vital signs reviewed and stable Respiratory status: spontaneous breathing, nonlabored ventilation, respiratory function stable and patient connected to nasal cannula oxygen Cardiovascular status: stable and blood pressure returned to baseline Postop Assessment: no apparent nausea or vomiting Anesthetic complications: no   No notable events documented.   Last Vitals:  Vitals:   03/08/23 1153 03/08/23 1200  BP: 122/69 121/77  Pulse: 70 64  Resp: 15 15  Temp: 36.5 C   SpO2: 98% 97%    Last Pain:  Vitals:   03/08/23 1153  TempSrc:   PainSc: 0-No pain                 Erica Oneal C Breklyn Fabrizio

## 2023-03-08 NOTE — H&P (Signed)
[facility]   Primary Care Physician:  System, Provider Not In Ophthalmologist: Dr. Willey Blade  Pre-Procedure History & Physical: HPI:  Erica Oneal is a 72 y.o. female here for cataract surgery.   Past Medical History:  Diagnosis Date   Chronic rhinitis    Depressed    GERD (gastroesophageal reflux disease)    Hyperlipidemia    NAFLD (nonalcoholic fatty liver disease)     Past Surgical History:  Procedure Laterality Date   OOPHORECTOMY Right     Prior to Admission medications   Medication Sig Start Date End Date Taking? Authorizing Provider  atorvastatin (LIPITOR) 80 MG tablet Take 40 mg by mouth daily.   Yes [provider]  b complex vitamins capsule Take 1 capsule by mouth daily.   Yes [provider]  cholecalciferol (VITAMIN D3) 25 MCG (1000 UNIT) tablet Take 1,000 Units by mouth daily.   Yes [provider]  escitalopram (LEXAPRO) 10 MG tablet Take 5 mg by mouth daily.   Yes [provider]  omeprazole (PRILOSEC) 20 MG capsule Take 20 mg by mouth daily.   Yes [provider]    Allergies as of 02/04/2023 - Review Complete 08/10/2022  Allergen Reaction Noted   Prednisone  12/09/2020   Sulfa antibiotics Photosensitivity 10/14/2014    Family History  Problem Relation Age of Onset   Alzheimer's disease Mother    Heart attack Father     Social History   Socioeconomic History   Marital status: Married    Spouse name: Not on file   Number of children: Not on file   Years of education: Not on file   Highest education level: Not on file  Occupational History   Not on file  Tobacco Use   Smoking status: Never   Smokeless tobacco: Never  Vaping Use   Vaping status: Never Used  Substance and Sexual Activity   Alcohol use: No   Drug use: No   Sexual activity: Not on file  Other Topics Concern   Not on file  Social History Narrative   Not on file   Social Determinants of Health   Financial Resource  Strain: Medium Risk (12/23/2022)   Received from [facility]   Overall Financial Resource Strain (CARDIA)    Difficulty of Paying Living Expenses: Somewhat hard  Food Insecurity: No Food Insecurity (12/23/2022)   Received from [facility]   Hunger Vital Sign    Worried About Running Out of Food in the Last Year: Never true    Ran Out of Food in the Last Year: Never true  Transportation Needs: No Transportation Needs (12/23/2022)   Received from [facility]   PRAPARE - Transportation    Lack of Transportation (Medical): No    Lack of Transportation (Non-Medical): No  Physical Activity: Not on file  Stress: Not on file  Social Connections: Not on file  Intimate Partner Violence: Not on file    Review of Systems: See HPI, otherwise negative ROS  Physical Exam: BP 133/72   Pulse 74   Temp (!) 97.5 F (36.4 C) (Temporal)   Resp 15   Ht 5' (1.524 m)   Wt 73 kg   SpO2 97%   BMI 31.42 kg/m  General:   Alert, cooperative in NAD Head:  Normocephalic and atraumatic. Respiratory:  Normal work of breathing. Cardiovascular:  RRR  Impression/Plan: Erica Oneal is here for cataract surgery.  Risks, benefits, limitations, and alternatives regarding cataract surgery have  been reviewed with the patient.  Questions have been answered.  All parties agreeable.   Willey Blade, MD  03/08/2023, 11:21 AM

## 2023-03-09 ENCOUNTER — Encounter: Payer: Self-pay | Admitting: Ophthalmology
